# Patient Record
Sex: Female | Born: 1965 | Race: White | Hispanic: No | Marital: Single | Smoking: Current every day smoker
Health system: Southern US, Community
[De-identification: ages and names within clinical notes are randomized; demographics above are authoritative.]

## PROBLEM LIST (undated history)

## (undated) DIAGNOSIS — K5090 Crohn's disease, unspecified, without complications: Secondary | ICD-10-CM

## (undated) HISTORY — PX: HERNIA REPAIR: SHX51

## (undated) HISTORY — PX: ESOPHAGOGASTRODUODENOSCOPY: SHX1529

---

## 2020-07-03 ENCOUNTER — Other Ambulatory Visit: Payer: Self-pay

## 2020-07-03 ENCOUNTER — Encounter: Payer: Self-pay | Admitting: Emergency Medicine

## 2020-07-03 ENCOUNTER — Ambulatory Visit
Admission: EM | Admit: 2020-07-03 | Discharge: 2020-07-03 | Disposition: A | Discharge status: Home / Self Care | Payer: Medicaid Other | Attending: Family Medicine | Admitting: Family Medicine

## 2020-07-03 DIAGNOSIS — H1132 Conjunctival hemorrhage, left eye: Secondary | ICD-10-CM | POA: Diagnosis present

## 2020-07-03 DIAGNOSIS — M545 Low back pain, unspecified: Secondary | ICD-10-CM

## 2020-07-03 HISTORY — DX: Crohn's disease, unspecified, without complications: K50.90

## 2020-07-03 LAB — URINALYSIS, COMPLETE (UACMP) WITH MICROSCOPIC
Bilirubin Urine: NEGATIVE
Glucose, UA: NEGATIVE mg/dL
Hgb urine dipstick: NEGATIVE
Ketones, ur: NEGATIVE mg/dL
Leukocytes,Ua: NEGATIVE
Nitrite: NEGATIVE
Protein, ur: NEGATIVE mg/dL
RBC / HPF: NONE SEEN RBC/hpf (ref 0–5)
Specific Gravity, Urine: 1.025 (ref 1.005–1.030)
pH: 7 (ref 5.0–8.0)

## 2020-07-03 MED ORDER — ACETAMINOPHEN 500 MG PO TABS: 1000.0000 mg | ORAL_TABLET | Freq: Three times a day (TID) | ORAL | 0 refills | Status: DC | PRN

## 2020-07-03 MED ORDER — BACLOFEN 10 MG PO TABS: 10.0000 mg | ORAL_TABLET | Freq: Three times a day (TID) | ORAL | 0 refills | Status: AC

## 2020-07-03 NOTE — ED Provider Notes (Signed)
[facility]    CSN: [id number] Arrival date & time: 07/03/20  1135      History   Chief Complaint Chief Complaint  Patient presents with  . Back Pain  . Eye Problem    HPI Crystal Michael is a 54 y.o. female.   HPI   Patient back pain started 2 days ago and feels like somebody has knee in back. Pain is constant. She can't get comfortable. No hx of kidney stones. Hx of back pain with constipation and Chron's disease.  Feels as if symptoms are not the same as for pain with Crohn's and constipation however.  Denies fever, diarrhea, dysuria, bladder or bowel incontinence, perianal numbness.  Takes no Chron's medication. Nothing treatments for pain.   Denies recent falls or trauma.  Woke up with red eye yesterday morning when she work up. Denies eye pain, eye discharge or itching of her eye.  Eye waters from time to time. No headache, cough, sore throat, vomiting or ear pain. Reports straining for bowel movement the day before yesterday. Hasn't lifted anything heavy. No recent sick contacts.    Past Medical History:  Diagnosis Date  . Crohn disease (HCC)     There are no problems to display for this patient.   Past Surgical History:  Procedure Laterality Date  . CESAREAN SECTION    . ESOPHAGOGASTRODUODENOSCOPY    . HERNIA REPAIR      OB History   No obstetric history on file.      Home Medications    Prior to Admission medications   Medication Sig Start Date End Date Taking? Authorizing Provider  acetaminophen (TYLENOL) 500 MG tablet Take 2 tablets (1,000 mg total) by mouth 3 (three) times daily as needed. 07/03/20   Sigismund Cross, Seward Meth, DO  baclofen (LIORESAL) 10 MG tablet Take 1 tablet (10 mg total) by mouth 3 (three) times daily. 07/03/20   Katha Cabal, DO    Family History History reviewed. No pertinent family history.  Social History Social History   Tobacco Use  . Smoking status: Current Every Day Smoker    Packs/day: 0.25    Types:  Cigarettes  . Smokeless tobacco: Never Used  Vaping Use  . Vaping Use: Never used  Substance Use Topics  . Alcohol use: Not Currently  . Drug use: Not Currently     Allergies   Sulfa antibiotics   Review of Systems Review of Systems : See HPI.    Physical Exam Triage Vital Signs ED Triage Vitals  Enc Vitals Group     BP 07/03/20 1219 116/71     Pulse Rate 07/03/20 1219 76     Resp 07/03/20 1219 18     Temp 07/03/20 1219 98.4 F (36.9 C)     Temp Source 07/03/20 1219 Oral     SpO2 07/03/20 1219 100 %     Weight 07/03/20 1214 137 lb (62.1 kg)     Height 07/03/20 1214 5\' 9"  (1.753 m)     Head Circumference --      Peak Flow --      Pain Score 07/03/20 1214 9     Pain Loc --      Pain Edu? --      Excl. in GC? --    No data found.  Updated Vital Signs BP 116/71 (BP Location: Left Arm)   Pulse 76   Temp 98.4 F (36.9 C) (Oral)   Resp 18   Ht 5\' 9"  (1.753 m)  Wt 137 lb (62.1 kg)   SpO2 100%   BMI 20.23 kg/m   Visual Acuity Right Eye Distance:   Left Eye Distance:   Bilateral Distance:    Right Eye Near:   Left Eye Near:    Bilateral Near:     Physical Exam Vitals and nursing note reviewed.  Constitutional:      General: She is not in acute distress.    Appearance: Normal appearance. She is well-developed.  HENT:     Head: Normocephalic and atraumatic.  Eyes:     General: Lids are normal. Lids are everted, no foreign bodies appreciated. Vision grossly intact.        Right eye: No foreign body or discharge.        Left eye: No foreign body or discharge.     Extraocular Movements: Extraocular movements intact.     Conjunctiva/sclera:     Right eye: Hemorrhage present.     Left eye: No hemorrhage.    Pupils: Pupils are equal, round, and reactive to light.  Cardiovascular:     Rate and Rhythm: Normal rate.  Pulmonary:     Effort: Pulmonary effort is normal. No respiratory distress.  Abdominal:     Palpations: Abdomen is soft.     Tenderness:  There is no abdominal tenderness.  Musculoskeletal:        General: Normal range of motion.     Cervical back: Normal range of motion.     Comments: Right flank spine tenderness, hypertrophic musculature, no bruising or ecchymosis, no C, T or L midline tenderness   Skin:    General: Skin is warm and dry.  Neurological:     General: No focal deficit present.     Mental Status: She is alert and oriented to person, place, and time.     Coordination: Coordination normal.     Comments: Strength 5/5 bilateral lower extremities   Psychiatric:        Mood and Affect: Mood normal.        Behavior: Behavior normal.      UC Treatments / Results  Labs (all labs ordered are listed, but only abnormal results are displayed) Labs Reviewed  URINALYSIS, COMPLETE (UACMP) WITH MICROSCOPIC - Abnormal; Notable for the following components:      Result Value   Bacteria, UA FEW (*)    All other components within normal limits    EKG   Radiology No results found.  Procedures Procedures (including critical care time)  Medications Ordered in UC Medications - No data to display  Initial Impression / Assessment and Plan / UC Course  I have reviewed the triage vital signs and the nursing notes.  Pertinent labs & imaging results that were available during my care of the patient were reviewed by me and considered in my medical decision making (see chart for details).     Patient is a 54 year old female with history of Crohn's disease presents for right-sided back pain and eye redness.   No history of kidney stones no dysuria less likely to be nephrolithiasis.  Back pain likely muscular in nature.  No red flags identified.  Will treat with muscle relaxers and Tylenol as patient cannot take NSAIDs.   Patient reports she was straining to have bowel movement which likely caused her subconjunctival hemorrhage.  Advised patient of the natural course of this disease.  No pain with extraocular movements  concerning for orbital cellulitis.   Final Clinical Impressions(s) / UC Diagnoses  Final diagnoses:  Acute right-sided low back pain without sciatica  Subconjunctival hematoma, left     Discharge Instructions     You were seen at the Urgent Care for back pain and eye pain. Please pick up your prescriptions at your pharmacy. Follow up with your PCP as needed.   If you haven't already, sign up for My Chart to have easy access to your labs results, and communication with your primary care physician.  Dr. Rachael Darby      ED Prescriptions    Medication Sig Dispense Auth. Provider   baclofen (LIORESAL) 10 MG tablet Take 1 tablet (10 mg total) by mouth 3 (three) times daily. 30 each Leobardo Granlund, DO   acetaminophen (TYLENOL) 500 MG tablet Take 2 tablets (1,000 mg total) by mouth 3 (three) times daily as needed. 30 tablet Katha Cabal, DO     PDMP not reviewed this encounter.   Katha Cabal, DO 07/03/20 1359

## 2020-07-03 NOTE — Discharge Instructions (Addendum)
You were seen at the Urgent Care for back pain and eye pain. Please pick up your prescriptions at your pharmacy. Follow up with your PCP as needed.   If you haven't already, sign up for My Chart to have easy access to your labs results, and communication with your primary care physician.  Dr. Rachael Darby

## 2020-07-03 NOTE — ED Triage Notes (Signed)
Pt c/o right sided flank pain started 2 days ago. Denies urinary symptoms.  She also c/o left sided eye redness that started yesterday. She state she can not see as well out affected eye. Denies headaches, dizziness.

## 2020-07-17 ENCOUNTER — Ambulatory Visit
Admission: EM | Admit: 2020-07-17 | Discharge: 2020-07-17 | Disposition: A | Discharge status: Home / Self Care | Payer: Medicaid Other | Attending: Physician Assistant | Admitting: Physician Assistant

## 2020-07-17 ENCOUNTER — Other Ambulatory Visit: Payer: Self-pay

## 2020-07-17 DIAGNOSIS — U071 COVID-19: Secondary | ICD-10-CM | POA: Diagnosis not present

## 2020-07-17 DIAGNOSIS — J069 Acute upper respiratory infection, unspecified: Secondary | ICD-10-CM

## 2020-07-17 DIAGNOSIS — F17210 Nicotine dependence, cigarettes, uncomplicated: Secondary | ICD-10-CM | POA: Diagnosis not present

## 2020-07-17 DIAGNOSIS — K5090 Crohn's disease, unspecified, without complications: Secondary | ICD-10-CM | POA: Diagnosis not present

## 2020-07-17 DIAGNOSIS — Z79899 Other long term (current) drug therapy: Secondary | ICD-10-CM | POA: Insufficient documentation

## 2020-07-17 MED ORDER — ACETAMINOPHEN 500 MG PO TABS: 1000.0000 mg | ORAL_TABLET | Freq: Three times a day (TID) | ORAL | 0 refills | Status: AC | PRN

## 2020-07-17 NOTE — ED Provider Notes (Signed)
[facility]    CSN: [id number] Arrival date & time: 07/17/20  1609      History   Chief Complaint Chief Complaint  Patient presents with  . Cough    HPI Crystal Michael is a 54 y.o. female.   Crystal Michael presents with complaints of 1 week of URI symptoms. Started out with sinus congestion, headaches, fatigue, body aches. Lost her sense of taste and smell, as well as developed a cough two days ago. Significant fatigue. Occasional fevers. Hasn't taken any medications for symptoms. Sometimes feels shortness of breath, denies currently. Cough is productive of phlegm. Thick mucus from nose. A few episodes of nausea but no vomiting or diarrhea. No known ill contacts, her daughter has had migraines recently, however. Fully vaccinated for covid-19. Works at [employer] as a Production assistant, radio.    ROS per HPI, negative if not otherwise mentioned.      Past Medical History:  Diagnosis Date  . Crohn disease (HCC)     There are no problems to display for this patient.   Past Surgical History:  Procedure Laterality Date  . CESAREAN SECTION    . ESOPHAGOGASTRODUODENOSCOPY    . HERNIA REPAIR      OB History   No obstetric history on file.      Home Medications    Prior to Admission medications   Medication Sig Start Date End Date Taking? Authorizing Provider  acetaminophen (TYLENOL) 500 MG tablet Take 2 tablets (1,000 mg total) by mouth every 8 (eight) hours as needed. 07/17/20   Georgetta Haber, NP  baclofen (LIORESAL) 10 MG tablet Take 1 tablet (10 mg total) by mouth 3 (three) times daily. 07/03/20   Katha Cabal, DO    Family History History reviewed. No pertinent family history.  Social History Social History   Tobacco Use  . Smoking status: Current Every Day Smoker    Packs/day: 0.50    Types: Cigarettes  . Smokeless tobacco: Never Used  Vaping Use  . Vaping Use: Never used  Substance Use Topics  . Alcohol use: Not Currently  . Drug use: Not  Currently     Allergies   Sulfa antibiotics   Review of Systems Review of Systems   Physical Exam Triage Vital Signs ED Triage Vitals  Enc Vitals Group     BP 07/17/20 1624 117/71     Pulse Rate 07/17/20 1624 76     Resp 07/17/20 1624 18     Temp 07/17/20 1624 98.4 F (36.9 C)     Temp Source 07/17/20 1624 Oral     SpO2 07/17/20 1624 98 %     Weight 07/17/20 1621 135 lb (61.2 kg)     Height 07/17/20 1621 5\' 9"  (1.753 m)     Head Circumference --      Peak Flow --      Pain Score 07/17/20 1620 5     Pain Loc --      Pain Edu? --      Excl. in GC? --    No data found.  Updated Vital Signs BP 117/71 (BP Location: Right Arm)   Pulse 76   Temp 98.4 F (36.9 C) (Oral)   Resp 18   Ht 5\' 9"  (1.753 m)   Wt 135 lb (61.2 kg)   SpO2 98%   BMI 19.94 kg/m   Visual Acuity Right Eye Distance:   Left Eye Distance:   Bilateral Distance:    Right Eye Near:  Left Eye Near:    Bilateral Near:     Physical Exam Constitutional:      General: She is not in acute distress.    Appearance: She is well-developed.  Cardiovascular:     Rate and Rhythm: Normal rate.  Pulmonary:     Effort: Pulmonary effort is normal.     Comments: Strong course cough noted  Skin:    General: Skin is warm and dry.  Neurological:     Mental Status: She is alert and oriented to person, place, and time.      UC Treatments / Results  Labs (all labs ordered are listed, but only abnormal results are displayed) Labs Reviewed  SARS CORONAVIRUS 2 (TAT 6-24 HRS)    EKG   Radiology No results found.  Procedures Procedures (including critical care time)  Medications Ordered in UC Medications - No data to display  Initial Impression / Assessment and Plan / UC Course  I have reviewed the triage vital signs and the nursing notes.  Pertinent labs & imaging results that were available during my care of the patient were reviewed by me and considered in my medical decision making (see  chart for details).     1 week of uri symptoms with loss of sense of taste and smell. Concerning for covid-19 with testing pending. Supportive cares recommended. Covid testing pending and isolation instructions provided.  Return precautions provided. Patient verbalized understanding and agreeable to plan.   Final Clinical Impressions(s) / UC Diagnoses   Final diagnoses:  Upper respiratory tract infection, unspecified type     Discharge Instructions     Push fluids to ensure adequate hydration and keep secretions thin.  Tylenol as needed.  Over the counter medications as needed for symptoms.  You may try some vitamins to help your immune system potentially:  Vitamin C 500mg  twice a day. Zinc 50mg  daily. Vitamin D 5000IU daily.   Self isolate until covid results are back and negative.  Will notify you by phone of any positive findings. Your negative results will be sent through your MyChart.         ED Prescriptions    Medication Sig Dispense Auth. Provider   acetaminophen (TYLENOL) 500 MG tablet Take 2 tablets (1,000 mg total) by mouth every 8 (eight) hours as needed. 30 tablet , NP     PDMP not reviewed this encounter.   , NP 07/17/20 1643

## 2020-07-17 NOTE — Discharge Instructions (Signed)
Push fluids to ensure adequate hydration and keep secretions thin.  Tylenol as needed.  Over the counter medications as needed for symptoms.  You may try some vitamins to help your immune system potentially:  Vitamin C 500mg  twice a day. Zinc 50mg  daily. Vitamin D 5000IU daily.   Self isolate until covid results are back and negative.  Will notify you by phone of any positive findings. Your negative results will be sent through your MyChart.

## 2020-07-17 NOTE — ED Triage Notes (Signed)
Patient complains of cough, body aches, fevers, no taste or smell. States that this started 1 week ago and worsened on Wednesday. Patient states that she works at [employer] and needs to know if she is positive.

## 2020-07-18 ENCOUNTER — Telehealth: Payer: Self-pay | Admitting: *Deleted

## 2020-07-18 ENCOUNTER — Other Ambulatory Visit (HOSPITAL_COMMUNITY): Payer: Self-pay

## 2020-07-18 LAB — SARS CORONAVIRUS 2 (TAT 6-24 HRS): SARS Coronavirus 2: POSITIVE — AB

## 2020-07-18 NOTE — Telephone Encounter (Signed)
This RN received a call from patient wanting to make an appointment for an infusion with the [facility] Infusion [facility].  She had spoken to the Infusion Center earlier but did not have transportation at the time.  Patient has now secured transportation.   According to patient tomorrow is the last day or window of opportunity for patient to get the infusion for COVID-19.  Patient really wants to get this infusion as she has Chron's Disease.  This RN sent a secure chat to Angus Seller, NP and and e-mail to both Angus Seller, NP and the Infusion Center.  This RN will contact patient upon hearing back from T. Tanda Rockers, NP or the Infusion Center.  Awaiting reply.    Patient's phone number is [phone].

## 2020-07-18 NOTE — Telephone Encounter (Signed)
At 9:15 p.m. this RN spoke with Essie Hart, who is the lead for the Infusion Center.  Corrie Dandy will have the NP screen Ms. Rhein in the a.m. and then give her a time to come for the infusion.  This RN then contacted Ms. Bahl at [phone] to inform her of this.  Patient is aware of the plan.  This RN also informed the patient that the Infusion Center is at [facility] in [place].  Patient was thinking it was in [place].  This RN provided the patient with the address for parking for the [facility] of [address] [place], [place].  Patient stated she would let her ride know that she would need to go to [facility] for the infusion.  Patient had no further questions or concerns.

## 2020-07-19 ENCOUNTER — Other Ambulatory Visit: Payer: Self-pay | Admitting: Physician Assistant

## 2020-07-19 ENCOUNTER — Ambulatory Visit (HOSPITAL_COMMUNITY)
Admission: RE | Admit: 2020-07-19 | Discharge: 2020-07-19 | Disposition: A | Discharge status: Home / Self Care | Payer: Medicaid Other | Source: Ambulatory Visit | Attending: Pulmonary Disease | Admitting: Pulmonary Disease

## 2020-07-19 DIAGNOSIS — K50919 Crohn's disease, unspecified, with unspecified complications: Secondary | ICD-10-CM | POA: Diagnosis present

## 2020-07-19 DIAGNOSIS — U071 COVID-19: Secondary | ICD-10-CM

## 2020-07-19 MED ORDER — METHYLPREDNISOLONE SODIUM SUCC 125 MG IJ SOLR: 125.0000 mg | Freq: Once | INTRAMUSCULAR | Status: DC | PRN

## 2020-07-19 MED ORDER — DIPHENHYDRAMINE HCL 50 MG/ML IJ SOLN: 50.0000 mg | Freq: Once | INTRAMUSCULAR | Status: DC | PRN

## 2020-07-19 MED ORDER — SODIUM CHLORIDE 0.9 % IV SOLN: INTRAVENOUS | Status: DC | PRN

## 2020-07-19 MED ORDER — SODIUM CHLORIDE 0.9 % IV SOLN
1200.0000 mg | Freq: Once | INTRAVENOUS | Status: AC
  Administered 2020-07-19: 1200 mg via INTRAVENOUS

## 2020-07-19 MED ORDER — ALBUTEROL SULFATE HFA 108 (90 BASE) MCG/ACT IN AERS: 2.0000 | INHALATION_SPRAY | Freq: Once | RESPIRATORY_TRACT | Status: DC | PRN

## 2020-07-19 MED ORDER — EPINEPHRINE 0.3 MG/0.3ML IJ SOAJ: 0.3000 mg | Freq: Once | INTRAMUSCULAR | Status: DC | PRN

## 2020-07-19 MED ORDER — FAMOTIDINE IN NACL 20-0.9 MG/50ML-% IV SOLN: 20.0000 mg | Freq: Once | INTRAVENOUS | Status: DC | PRN

## 2020-07-19 NOTE — Progress Notes (Signed)
°  Diagnosis: COVID-19 ° °Physician: Dr. Wright ° °Procedure: [facility] Med: casirivimab\imdevimab infusion - Provided patient with casirivimab\imdevimab fact sheet for patients, parents and caregivers prior to infusion. ° °Complications: No immediate complications noted. ° °Discharge: Discharged home  ° °Cristela Stalder R Nihira Puello °07/19/2020 °

## 2020-07-19 NOTE — Discharge Instructions (Signed)

## 2020-07-19 NOTE — Progress Notes (Signed)
I connected by phone with Crystal Michael on 07/19/2020 at 10:09 AM to discuss the potential use of a new treatment for mild to moderate COVID-19 viral infection in non-hospitalized patients.  This patient is a 54 y.o. female that meets the FDA criteria for Emergency Use Authorization of COVID monoclonal antibody casirivimab/imdevimab.  Has a (+) direct SARS-CoV-2 viral test result  Has mild or moderate COVID-19   Is NOT hospitalized due to COVID-19  Is within 10 days of symptom onset  Has at least one of the high risk factor(s) for progression to severe COVID-19 and/or hospitalization as defined in EUA.  Specific high risk criteria : Immunosuppressive Disease or Treatment and Other high risk medical condition per CDC:  social vulnerability   I have spoken and communicated the following to the patient or parent/caregiver regarding COVID monoclonal antibody treatment:  1. FDA has authorized the emergency use for the treatment of mild to moderate COVID-19 in adults and pediatric patients with positive results of direct SARS-CoV-2 viral testing who are 66 years of age and older weighing at least 40 kg, and who are at high risk for progressing to severe COVID-19 and/or hospitalization.  2. The significant known and potential risks and benefits of COVID monoclonal antibody, and the extent to which such potential risks and benefits are unknown.  3. Information on available alternative treatments and the risks and benefits of those alternatives, including clinical trials.  4. Patients treated with COVID monoclonal antibody should continue to self-isolate and use infection control measures (e.g., wear mask, isolate, social distance, avoid sharing personal items, clean and disinfect "high touch" surfaces, and frequent handwashing) according to CDC guidelines.   5. The patient or parent/caregiver has the option to accept or refuse COVID monoclonal antibody treatment.  After reviewing this  information with the patient, the patient has agreed to receive one of the available covid 19 monoclonal antibodies and will be provided an appropriate fact sheet prior to infusion.  Sx onset 9/23. Set up for infusion on 9/26 @ 12:30pm. Directions given to [facility]. Pt is aware that insurance will be charged an infusion fee. Pt is fully vaccinated with moderna.   [name] 07/19/2020 10:09 AM

## 2021-01-03 ENCOUNTER — Emergency Department
Admission: EM | Admit: 2021-01-03 | Discharge: 2021-01-03 | Disposition: A | Discharge status: Home / Self Care | Payer: 59 | Attending: Emergency Medicine | Admitting: Emergency Medicine

## 2021-01-03 ENCOUNTER — Other Ambulatory Visit: Payer: Self-pay

## 2021-01-03 ENCOUNTER — Emergency Department: Payer: 59

## 2021-01-03 ENCOUNTER — Encounter: Payer: Self-pay | Admitting: Emergency Medicine

## 2021-01-03 DIAGNOSIS — F17210 Nicotine dependence, cigarettes, uncomplicated: Secondary | ICD-10-CM | POA: Insufficient documentation

## 2021-01-03 DIAGNOSIS — J069 Acute upper respiratory infection, unspecified: Secondary | ICD-10-CM | POA: Diagnosis not present

## 2021-01-03 DIAGNOSIS — R059 Cough, unspecified: Secondary | ICD-10-CM | POA: Diagnosis present

## 2021-01-03 LAB — COMPREHENSIVE METABOLIC PANEL
ALT: 22 U/L (ref 0–44)
AST: 22 U/L (ref 15–41)
Albumin: 3.4 g/dL — ABNORMAL LOW (ref 3.5–5.0)
Alkaline Phosphatase: 68 U/L (ref 38–126)
Anion gap: 7 (ref 5–15)
BUN: 6 mg/dL (ref 6–20)
CO2: 23 mmol/L (ref 22–32)
Calcium: 8.4 mg/dL — ABNORMAL LOW (ref 8.9–10.3)
Chloride: 108 mmol/L (ref 98–111)
Creatinine, Ser: 0.66 mg/dL (ref 0.44–1.00)
GFR, Estimated: >60 mL/min (ref >60)
Glucose, Bld: 117 mg/dL — ABNORMAL HIGH (ref 70–99)
Potassium: 3.5 mmol/L (ref 3.5–5.1)
Sodium: 138 mmol/L (ref 135–145)
Total Bilirubin: 0.5 mg/dL (ref 0.3–1.2)
Total Protein: 5.8 g/dL — ABNORMAL LOW (ref 6.5–8.1)

## 2021-01-03 LAB — CBC WITH DIFFERENTIAL/PLATELET
Abs Immature Granulocytes: 0.03 10*3/uL (ref 0.00–0.07)
Basophils Absolute: 0 10*3/uL (ref 0.0–0.1)
Basophils Relative: 0 %
Eosinophils Absolute: 0.1 10*3/uL (ref 0.0–0.5)
Eosinophils Relative: 1 %
HCT: 39.7 % (ref 36.0–46.0)
Hemoglobin: 13.4 g/dL (ref 12.0–15.0)
Immature Granulocytes: 0 %
Lymphocytes Relative: 17 %
Lymphs Abs: 1.8 10*3/uL (ref 0.7–4.0)
MCH: 31.6 pg (ref 26.0–34.0)
MCHC: 33.8 g/dL (ref 30.0–36.0)
MCV: 93.6 fL (ref 80.0–100.0)
Monocytes Absolute: 0.6 10*3/uL (ref 0.1–1.0)
Monocytes Relative: 6 %
Neutro Abs: 7.9 10*3/uL — ABNORMAL HIGH (ref 1.7–7.7)
Neutrophils Relative %: 76 %
Platelets: 362 10*3/uL (ref 150–400)
RBC: 4.24 MIL/uL (ref 3.87–5.11)
RDW: 13.9 % (ref 11.5–15.5)
WBC: 10.4 10*3/uL (ref 4.0–10.5)
nRBC: 0 % (ref 0.0–0.2)

## 2021-01-03 MED ORDER — IOHEXOL 300 MG/ML  SOLN
75.0000 mL | Freq: Once | INTRAMUSCULAR | Status: AC | PRN
  Administered 2021-01-03: 75 mL via INTRAVENOUS
  Filled 2021-01-03: qty 75

## 2021-01-03 MED ORDER — BENZONATATE 100 MG PO CAPS
100.0000 mg | ORAL_CAPSULE | Freq: Once | ORAL | Status: AC
  Administered 2021-01-03: 100 mg via ORAL
  Filled 2021-01-03: qty 1

## 2021-01-03 MED ORDER — BENZONATATE 100 MG PO CAPS: 100.0000 mg | ORAL_CAPSULE | Freq: Three times a day (TID) | ORAL | 0 refills | Status: DC | PRN

## 2021-01-03 NOTE — Discharge Instructions (Addendum)
Continue to monitor symptoms.  Use Tessalon Perles for cough up to 3 times daily as needed.  Follow-up with primary care regarding your lung CT findings with possible repeat scans in the future if needed.  Return to ER for any worsening.

## 2021-01-03 NOTE — ED Triage Notes (Signed)
Pt to ED via POV c/o cough, nausea congestion, and intermittent fever. Pt states that she had a negative COVID test on Sunday. Pt states that she is not  Getting any better. Pt is in NAD.

## 2021-01-03 NOTE — ED Provider Notes (Signed)
[facility] Emergency Department Provider Note  ____________________________________________   Event Date/Time   First MD Initiated Contact with Patient 01/03/21 1508     (approximate)  I have reviewed the triage vital signs and the nursing notes.   HISTORY  Chief Complaint Cough and Nasal Congestion  HPI Crystal Michael is a 55 y.o. female who reports to the emergency department for evaluation of cough, nausea, nasal congestion and intermittent fever that has been present for the last 2 weeks.  Husband and daughter are also here at the same time for evaluation of similar symptoms.  She has not taken her temperature as she does not have a thermometer at home, but reports she just feels "hot".  Patient did get a Covid test that was a rapid test approximately 1 week ago and was negative at that time.  She did not receive PCR testing.  She did have positive Covid infection 1 January, 2022 but had a period of complete resolution of symptoms before they returned 2 weeks ago.  She reports using over-the-counter cough and cold medications over the last few weeks without any relief for her symptoms and they have persisted.  Patient does have Crohn's disease and admits she has had a few episodes of bloody diarrhea, however this has since improved and she thinks this was related to the stress.  She is not currently having any bloody diarrhea.  She denies any chest pain, shortness of breath, abdominal pain.         Past Medical History:  Diagnosis Date  . Crohn disease (HCC)     There are no problems to display for this patient.   Past Surgical History:  Procedure Laterality Date  . CESAREAN SECTION    . ESOPHAGOGASTRODUODENOSCOPY    . HERNIA REPAIR      Prior to Admission medications   Medication Sig Start Date End Date Taking? Authorizing Provider  benzonatate (TESSALON PERLES) 100 MG capsule Take 1 capsule (100 mg total) by mouth 3 (three) times daily as  needed for cough. 01/03/21 01/03/22 Yes Rodgers, Ruben Gottron, PA  acetaminophen (TYLENOL) 500 MG tablet Take 2 tablets (1,000 mg total) by mouth every 8 (eight) hours as needed. 07/17/20   Georgetta Haber, NP  baclofen (LIORESAL) 10 MG tablet Take 1 tablet (10 mg total) by mouth 3 (three) times daily. 07/03/20   Katha Cabal, DO    Allergies Sulfa antibiotics  No family history on file.  Social History Social History   Tobacco Use  . Smoking status: Current Every Day Smoker    Packs/day: 0.50    Types: Cigarettes  . Smokeless tobacco: Never Used  Vaping Use  . Vaping Use: Never used  Substance Use Topics  . Alcohol use: Not Currently  . Drug use: Not Currently    Review of Systems Constitutional: + fever/chills Eyes: No visual changes. ENT: + Nasal congestion, no sore throat. Cardiovascular: Denies chest pain. Respiratory: + Cough, denies shortness of breath. Gastrointestinal: No abdominal pain.  + nausea, no vomiting.  + diarrhea.  No constipation. Genitourinary: Negative for dysuria. Musculoskeletal: Negative for back pain. Skin: Negative for rash. Neurological: Negative for headaches, focal weakness or numbness. ____________________________________________   PHYSICAL EXAM:  VITAL SIGNS: ED Triage Vitals  Enc Vitals Group     BP 01/03/21 1429 (!) 156/88     Pulse Rate 01/03/21 1429 88     Resp 01/03/21 1429 18     Temp 01/03/21 1429 97.8 F (36.6 C)  Temp Source 01/03/21 1429 Oral     SpO2 01/03/21 1430 98 %     Weight 01/03/21 1428 145 lb (65.8 kg)     Height 01/03/21 1428 5\' 9"  (1.753 m)     Head Circumference --      Peak Flow --      Pain Score 01/03/21 1428 0     Pain Loc --      Pain Edu? --      Excl. in GC? --    Constitutional: Alert and oriented. Well appearing and in no acute distress. Eyes: Conjunctivae are normal. PERRL. EOMI. Head: Atraumatic. Nose: Mild congestion/rhinnorhea. Mouth/Throat: Mucous membranes are moist.  Neck: No  stridor.   Cardiovascular: Normal rate, regular rhythm. Grossly normal heart sounds.  Good peripheral circulation. Respiratory: Normal respiratory effort.  No retractions. Lungs CTAB. Gastrointestinal: Soft and nontender. No distention. No abdominal bruits. No CVA tenderness. Musculoskeletal: No lower extremity tenderness nor edema.  No joint effusions. Neurologic:  Normal speech and language. No gross focal neurologic deficits are appreciated. No gait instability. Skin:  Skin is warm, dry and intact. No rash noted. Psychiatric: Mood and affect are normal. Speech and behavior are normal.  ____________________________________________   LABS (all labs ordered are listed, but only abnormal results are displayed)  Labs Reviewed  CBC WITH DIFFERENTIAL/PLATELET - Abnormal; Notable for the following components:      Result Value   Neutro Abs 7.9 (*)    All other components within normal limits  COMPREHENSIVE METABOLIC PANEL - Abnormal; Notable for the following components:   Glucose, Bld 117 (*)    Calcium 8.4 (*)    Total Protein 5.8 (*)    Albumin 3.4 (*)    All other components within normal limits  CBC WITH DIFFERENTIAL/PLATELET   ____________________________________________  RADIOLOGY  Official radiology report(s): DG Chest 2 View  Result Date: 01/03/2021 CLINICAL DATA:  Cough and fever. EXAM: CHEST - 2 VIEW COMPARISON:  None. FINDINGS: The heart, hila, and mediastinum are normal. No pneumothorax. No focal infiltrates. Nodular contour project over the right first costochondral junction. No other nodules or masses. No other acute abnormalities. IMPRESSION: There is a nodular density projected over the right first costochondral junction, likely confluence of shadows. A true nodule is considered less likely. A chest x-ray with lordotic positioning or a CT scan are recommended for further evaluation. No other abnormalities. Electronically Signed   By: 01/05/2021 III M.D   On:  01/03/2021 15:57   CT Chest W Contrast  Result Date: 01/03/2021 CLINICAL DATA:  Worsening cough for 2 weeks. Sent to follow-up possible nodule seen on earlier chest plain film. EXAM: CT CHEST WITH CONTRAST TECHNIQUE: Multidetector CT imaging of the chest was performed during intravenous contrast administration. CONTRAST:  55mL OMNIPAQUE IOHEXOL 300 MG/ML  SOLN COMPARISON:  None. FINDINGS: Cardiovascular: There is mild calcification of the aortic arch, without evidence of aneurysmal dilatation or dissection. Normal heart size. No pericardial effusion. Mediastinum/Nodes: No enlarged mediastinal, hilar, or axillary lymph nodes. Thyroid gland, trachea, and esophagus demonstrate no significant findings. Lungs/Pleura: 2 mm noncalcified lung nodules are seen within the lateral aspect of the left upper lobe (axial CT images 41 and 56, CT series number 3). A 4 mm noncalcified lung nodule versus focal scar is seen within the anterolateral aspect of the right middle lobe (axial CT image 87, CT series number 3). A 5 mm noncalcified lung nodule is seen along the posterolateral aspect of the left lung base (axial CT  image 107, CT series number 3). A chronic versus congenital 2.5 cm x 2.1 cm thin walled cyst is seen within the left lower lobe. Very mild atelectasis is noted along the posterior aspects of the bilateral lung bases. There is no evidence of a pleural effusion or pneumothorax. Upper Abdomen: There is a small hiatal hernia. Musculoskeletal: Degenerative changes seen within the thoracic spine. IMPRESSION: 1. Small bilateral noncalcified lung nodules, as described above. No follow-up needed if patient is low-risk (and has no known or suspected primary neoplasm). Non-contrast chest CT can be considered in 12 months if patient is high-risk. This recommendation follows the consensus statement: Guidelines for Management of Incidental Pulmonary Nodules Detected on CT Images: From the Fleischner Society 2017; Radiology  2017; 284:228-243. 2. Small hiatal hernia. 3. Aortic atherosclerosis. Aortic Atherosclerosis (ICD10-I70.0). Electronically Signed   By: Aram Candela M.D.   On: 01/03/2021 19:16    ____________________________________________   INITIAL IMPRESSION / ASSESSMENT AND PLAN / ED COURSE  As part of my medical decision making, I reviewed the following data within the electronic MEDICAL RECORD NUMBER Nursing notes reviewed and incorporated, Labs reviewed, Radiograph reviewed and Notes from prior ED visits        Patient is a 55 year old female who presents to the emergency department for evaluation of cough, nasal congestion for the last 2 weeks.  See HPI for further details.  In triage, the patient is mildly hypertensive but otherwise has normal vital signs.  On physical exam, she is noted to have a mild amount of nasal congestion, but otherwise grossly normal exam.  Given the longevity of her symptoms, x-ray was obtained and is concerning for shadow over the right superior lung margins.  When discussing this with the patient, she reports that she has been told that she has something similar before, and then reports that she was exposed to TB a long time ago and had to carry a paper that she had been tested negative for TB.  Given her symptoms as well as being unsure of the possible shadow and the fact that she is a smoker and high risk for mass, CT with contrast will be obtained.  Laboratory evaluation was initiated and CBC is grossly within normal limits.  CMP is unremarkable.  Chest CT does show multiple nodules, no acute infection related.  Will recommend follow-up with primary care to determine need for repeat imaging in the future.  In the interim, will treat patient's symptoms with Tessalon Perles.  This is likely viral in nature given work-up and exam as well as family members with similar symptoms.  We will not repeat the patient's respiratory panel today, as her PCR is likely to remain positive from  prior Covid infection in early January.  Patient is amenable this plan is stable this time for outpatient follow-up.      ____________________________________________   FINAL CLINICAL IMPRESSION(S) / ED DIAGNOSES  Final diagnoses:  Viral URI with cough     ED Discharge Orders         Ordered    benzonatate (TESSALON PERLES) 100 MG capsule  3 times daily PRN        01/03/21 1924          *Please note:  Jiali Linney was evaluated in Emergency Department on 01/03/2021 for the symptoms described in the history of present illness. She was evaluated in the context of the global COVID-19 pandemic, which necessitated consideration that the patient might be at risk for infection with the  SARS-CoV-2 virus that causes COVID-19. Institutional protocols and algorithms that pertain to the evaluation of patients at risk for COVID-19 are in a state of rapid change based on information released by regulatory bodies including the CDC and federal and state organizations. These policies and algorithms were followed during the patient's care in the ED.  Some ED evaluations and interventions may be delayed as a result of limited staffing during and the pandemic.*   Note:  This document was prepared using Dragon voice recognition software and may include unintentional dictation errors.   Lucy Chris, PA 01/03/21 2329    Phineas Semen, MD 01/12/21 [phone]

## 2021-04-03 ENCOUNTER — Emergency Department: Payer: Medicaid Other

## 2021-04-03 ENCOUNTER — Other Ambulatory Visit: Payer: Self-pay

## 2021-04-03 ENCOUNTER — Emergency Department
Admission: EM | Admit: 2021-04-03 | Discharge: 2021-04-03 | Disposition: A | Discharge status: Home / Self Care | Payer: Medicaid Other | Attending: Emergency Medicine | Admitting: Emergency Medicine

## 2021-04-03 DIAGNOSIS — Z20822 Contact with and (suspected) exposure to covid-19: Secondary | ICD-10-CM | POA: Diagnosis not present

## 2021-04-03 DIAGNOSIS — J069 Acute upper respiratory infection, unspecified: Secondary | ICD-10-CM | POA: Diagnosis not present

## 2021-04-03 DIAGNOSIS — R059 Cough, unspecified: Secondary | ICD-10-CM | POA: Diagnosis present

## 2021-04-03 DIAGNOSIS — F17210 Nicotine dependence, cigarettes, uncomplicated: Secondary | ICD-10-CM | POA: Insufficient documentation

## 2021-04-03 LAB — RESP PANEL BY RT-PCR (FLU A&B, COVID) ARPGX2
Influenza A by PCR: NEGATIVE
Influenza B by PCR: NEGATIVE
SARS Coronavirus 2 by RT PCR: NEGATIVE

## 2021-04-03 MED ORDER — BENZONATATE 100 MG PO CAPS: ORAL_CAPSULE | ORAL | 0 refills | Status: AC

## 2021-04-03 MED ORDER — PREDNISONE 20 MG PO TABS
60.0000 mg | ORAL_TABLET | Freq: Once | ORAL | Status: AC
  Administered 2021-04-03: 60 mg via ORAL
  Filled 2021-04-03: qty 3

## 2021-04-03 MED ORDER — ALBUTEROL SULFATE HFA 108 (90 BASE) MCG/ACT IN AERS: 2.0000 | INHALATION_SPRAY | Freq: Four times a day (QID) | RESPIRATORY_TRACT | 0 refills | Status: AC | PRN

## 2021-04-03 MED ORDER — PREDNISONE 20 MG PO TABS: 20.0000 mg | ORAL_TABLET | Freq: Two times a day (BID) | ORAL | 0 refills | Status: AC

## 2021-04-03 NOTE — ED Notes (Signed)
Patient verbalized understanding of discharge instructions. Patient verbalized understanding of prescriptions and follow up care. Patient provided work note provided by [place], [place].

## 2021-04-03 NOTE — ED Notes (Signed)
ED Provider at bedside. 

## 2021-04-03 NOTE — ED Triage Notes (Signed)
Pt states since Wednesday she has had cough and body aches, pt states her throat is sore also. States she had a URI a few months ago and this feels the same

## 2021-04-03 NOTE — ED Notes (Signed)
Patient reports cough, congestion, headache, chills, and fatigue. Patient denies known COVID exposure, but reports hx of sinus infections.

## 2021-04-03 NOTE — ED Provider Notes (Signed)
Crystal Michael ____________________________________________  Time seen: 2059  I have reviewed the triage vital signs and the nursing notes.  HISTORY  Chief Complaint  Cough and Generalized Body Aches   HPI Crystal Michael is a 55 y.o. female below medical history, presents to the ED for evaluation of 3 days of intermittent cough and body aches.  She is describing sore throat from the cough as well as subjective fevers.  She reports being vaccinated against COVID and flu.  Her husband/partner is present be evaluated,, and reports similar symptoms.  Past Medical History:  Diagnosis Date   Crohn disease (HCC)     There are no problems to display for this patient.   Past Surgical History:  Procedure Laterality Date   CESAREAN SECTION     ESOPHAGOGASTRODUODENOSCOPY     HERNIA REPAIR      Prior to Admission medications   Medication Sig Start Date End Date Taking? Authorizing Provider  albuterol (VENTOLIN HFA) 108 (90 Base) MCG/ACT inhaler Inhale 2 puffs into the lungs every 6 (six) hours as needed for shortness of breath. 04/03/21  Yes Darianne Muralles, Charlesetta Ivory, PA-C  benzonatate (TESSALON PERLES) 100 MG capsule Take 1-2 tabs TID prn cough 04/03/21  Yes Hayli Milligan, Charlesetta Ivory, PA-C  predniSONE (DELTASONE) 20 MG tablet Take 1 tablet (20 mg total) by mouth 2 (two) times daily with a meal for 5 days. 04/03/21 04/08/21 Yes Adaia Matthies, Charlesetta Ivory, PA-C  acetaminophen (TYLENOL) 500 MG tablet Take 2 tablets (1,000 mg total) by mouth every 8 (eight) hours as needed. 07/17/20   Georgetta Haber, NP  baclofen (LIORESAL) 10 MG tablet Take 1 tablet (10 mg total) by mouth 3 (three) times daily. 07/03/20   Katha Cabal, DO    Allergies Sulfa antibiotics  History reviewed. No pertinent family history.  Social History Social History   Tobacco Use   Smoking status: Every Day    Packs/day: 0.50    Pack years: 0.00    Types: Cigarettes    Smokeless tobacco: Never  Vaping Use   Vaping Use: Never used  Substance Use Topics   Alcohol use: Not Currently   Drug use: Not Currently    Review of Systems  Constitutional: Negative for fever. Eyes: Negative for visual changes. ENT: Positive for sore throat. Cardiovascular: Negative for chest pain. Respiratory: Negative for shortness of breath.  Reports cough as above. Gastrointestinal: Negative for abdominal pain, vomiting and diarrhea. Genitourinary: Negative for dysuria. Musculoskeletal: Negative for back pain.  Reports generalized body aches. Skin: Negative for rash. Neurological: Negative for headaches, focal weakness or numbness. ____________________________________________  PHYSICAL EXAM:  VITAL SIGNS: ED Triage Vitals [04/03/21 2016]  Enc Vitals Group     BP 130/86     Pulse Rate 96     Resp 18     Temp 99 F (37.2 C)     Temp src      SpO2 97 %     Weight 147 lb (66.7 kg)     Height 5\' 9"  (1.753 m)     Head Circumference      Peak Flow      Pain Score 10     Pain Loc      Pain Edu?      Excl. in GC?     Constitutional: Alert and oriented. Well appearing and in no distress. Head: Normocephalic and atraumatic. Eyes: Conjunctivae are normal. Normal extraocular movements Ears: Canals clear. TMs intact bilaterally. Nose:  No congestion/rhinorrhea/epistaxis. Mouth/Throat: Mucous membranes are moist. Neck: Supple. No thyromegaly. Hematological/Lymphatic/Immunological: No cervical lymphadenopathy. Cardiovascular: Normal rate, regular rhythm. Normal distal pulses. Respiratory: Normal respiratory effort. No wheezes/rales/rhonchi. Gastrointestinal: Soft and nontender. No distention. Musculoskeletal: Nontender with normal range of motion in all extremities.  Neurologic:  Normal gait without ataxia. Normal speech and language. No gross focal neurologic deficits are appreciated. Skin:  Skin is warm, dry and intact. No rash noted. Psychiatric: Mood and affect  are normal. Patient exhibits appropriate insight and judgment. ____________________________________________   LABS (pertinent positives/negatives) Labs Reviewed  RESP PANEL BY RT-PCR (FLU A&B, COVID) [id number]  ____________________________________________   RADIOLOGY  CXR  IMPRESSION: No active disease. ____________________________________________  PROCEDURES  Prednisone 60 mg PO  Procedures ____________________________________________   INITIAL IMPRESSION / ASSESSMENT AND PLAN / ED COURSE  As part of my medical decision making, I reviewed the following data within the electronic MEDICAL RECORD NUMBER Labs reviewed WNL, Radiograph reviewed NAD, and Notes from prior ED visits   DDX: viral URI, Covid, influenza, CAP  Patient ED evaluation of 3 days of cough and body aches.  Patient was evaluated for complaints in the ED and was found to have a negative viral panel screen.  X-rays also negative for any acute findings.  Patient was treated empirically for viral URI with cough.  She had initial dose of prednisone here in the ED, is discharged with prescription for albuterol and prednisone as well as Tessalon Perles.  She is referred to local community clinic for ongoing symptoms.  Return precautions have been discussed.  Crystal Michael was evaluated in Emergency Department on 04/03/2021 for the symptoms described in the history of present illness. She was evaluated in the context of the global COVID-19 pandemic, which necessitated consideration that the patient might be at risk for infection with the SARS-CoV-2 virus that causes COVID-19. Institutional protocols and algorithms that pertain to the evaluation of patients at risk for COVID-19 are in a state of rapid change based on information released by regulatory bodies including the CDC and federal and state organizations. These policies and algorithms were followed during the patient's care in the  ED.  ____________________________________________  FINAL CLINICAL IMPRESSION(S) / ED DIAGNOSES  Final diagnoses:  Viral URI with cough      Amel Gianino, Charlesetta Ivory, PA-C 04/03/21 2357    Sharman Cheek, MD 04/04/21 2359

## 2021-04-03 NOTE — Discharge Instructions (Addendum)
Your exam, chest XR, and viral tests (covid/flu) are all negative at this time. Take the prednisone along with the cough medicine and inhaler as directed. You may also take OTC Delsym for additional cough relief. Follow-up with your provider or [facility] as needed.

## 2022-01-08 IMAGING — DX DG CHEST 1V PORT
1 series · 1 of 1 positions shown · non-contrast
Comparison: 07/06/2021

CLINICAL DATA: Cough, congestion

EXAM:
PORTABLE CHEST 1 VIEW

[chest ap]
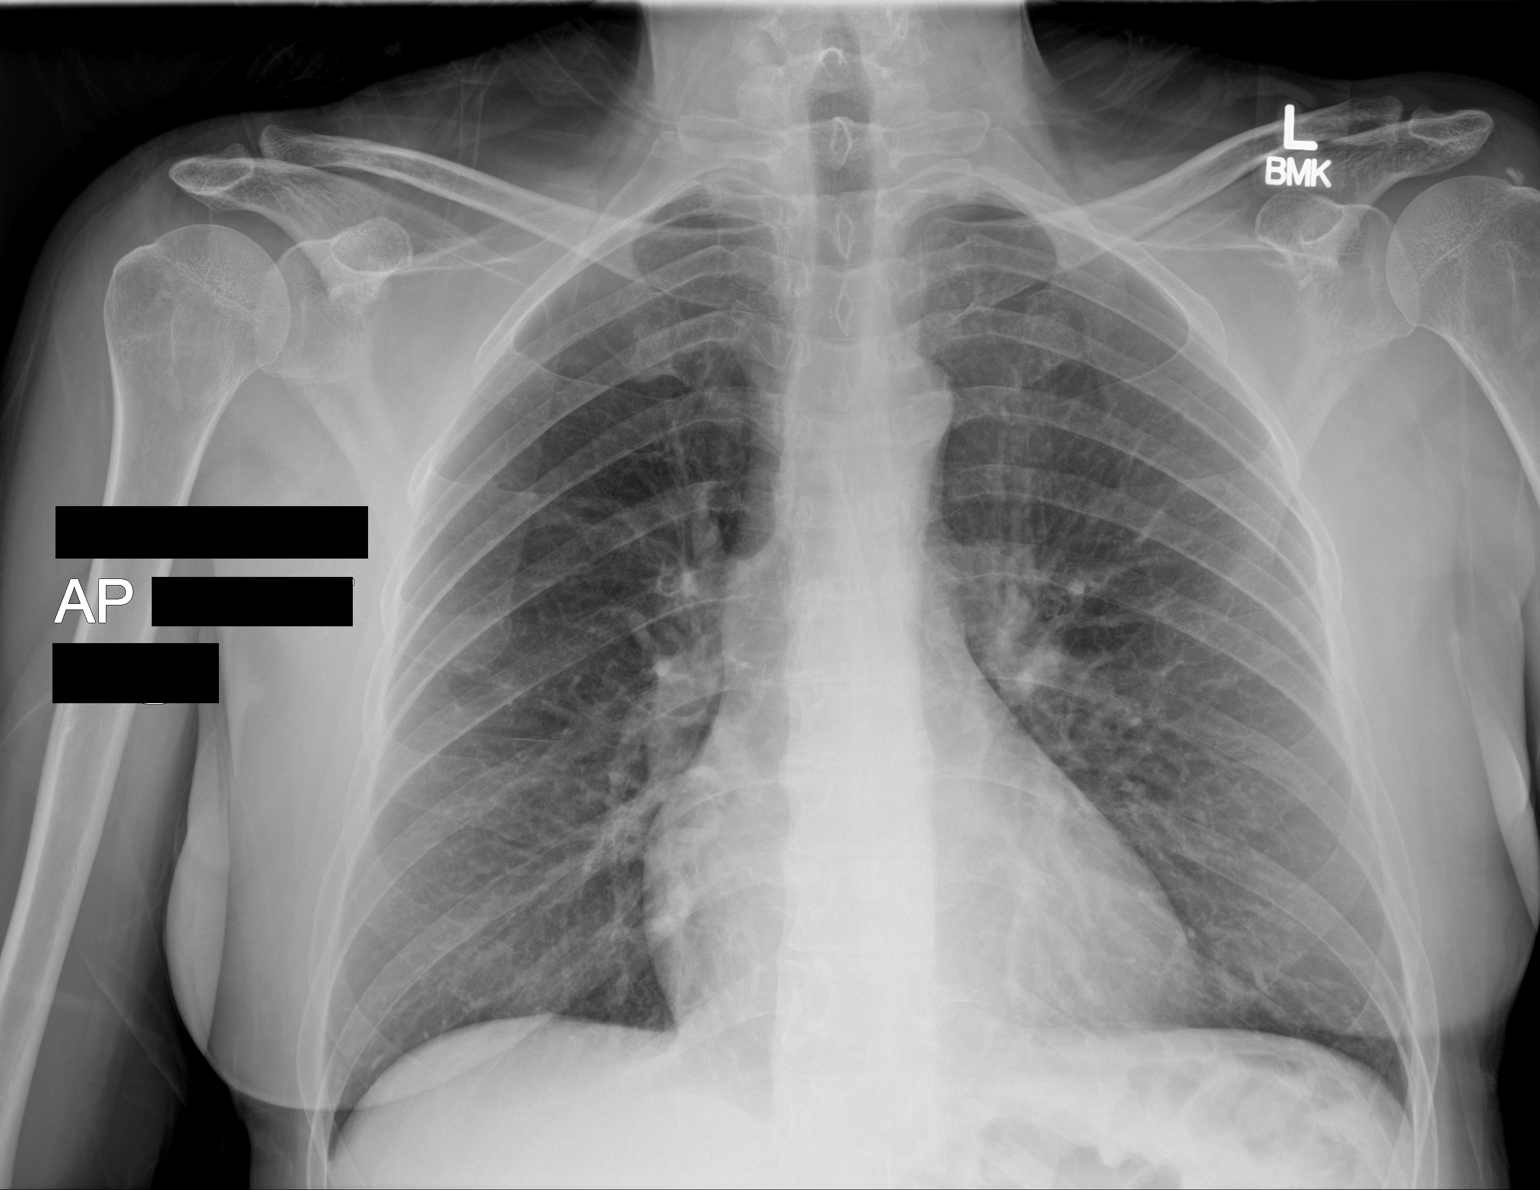

[1 of 1 positions shown; findings below may reference images not displayed]

FINDINGS: Heart and mediastinal contours are within normal limits. No focal
opacities or effusions. No acute bony abnormality.
IMPRESSION: No active disease.
